# Patient Record
Sex: Female | Born: 1985 | Race: White | Hispanic: No | Marital: Married | Smoking: Never smoker
Health system: Southern US, Community
[De-identification: ages and names within clinical notes are randomized; demographics above are authoritative.]

## PROBLEM LIST (undated history)

## (undated) DIAGNOSIS — N200 Calculus of kidney: Secondary | ICD-10-CM

## (undated) DIAGNOSIS — D72829 Elevated white blood cell count, unspecified: Secondary | ICD-10-CM

## (undated) HISTORY — DX: Calculus of kidney: N20.0

## (undated) HISTORY — DX: Elevated white blood cell count, unspecified: D72.829

---

## 2012-09-14 DIAGNOSIS — F39 Unspecified mood [affective] disorder: Secondary | ICD-10-CM | POA: Insufficient documentation

## 2012-09-14 DIAGNOSIS — J309 Allergic rhinitis, unspecified: Secondary | ICD-10-CM | POA: Insufficient documentation

## 2012-11-19 DIAGNOSIS — Z9109 Other allergy status, other than to drugs and biological substances: Secondary | ICD-10-CM | POA: Insufficient documentation

## 2013-02-07 DIAGNOSIS — N200 Calculus of kidney: Secondary | ICD-10-CM | POA: Insufficient documentation

## 2013-02-20 DIAGNOSIS — N201 Calculus of ureter: Secondary | ICD-10-CM | POA: Insufficient documentation

## 2013-09-07 DIAGNOSIS — K219 Gastro-esophageal reflux disease without esophagitis: Secondary | ICD-10-CM | POA: Insufficient documentation

## 2013-12-13 ENCOUNTER — Ambulatory Visit: Payer: Self-pay | Admitting: Oncology

## 2013-12-13 LAB — CBC CANCER CENTER
BASOS ABS: 0.1 x10 3/mm (ref 0.0–0.1)
Basophil %: 1 %
EOS PCT: 5.7 %
Eosinophil #: 0.8 x10 3/mm — ABNORMAL HIGH (ref 0.0–0.7)
HCT: 39.5 % (ref 35.0–47.0)
HGB: 13 g/dL (ref 12.0–16.0)
LYMPHS PCT: 30.2 %
Lymphocyte #: 4.2 x10 3/mm — ABNORMAL HIGH (ref 1.0–3.6)
MCH: 26.3 pg (ref 26.0–34.0)
MCHC: 32.9 g/dL (ref 32.0–36.0)
MCV: 80 fL (ref 80–100)
Monocyte #: 0.4 x10 3/mm (ref 0.2–0.9)
Monocyte %: 2.8 %
Neutrophil #: 8.3 x10 3/mm — ABNORMAL HIGH (ref 1.4–6.5)
Neutrophil %: 60.3 %
Platelet: 387 x10 3/mm (ref 150–440)
RBC: 4.94 10*6/uL (ref 3.80–5.20)
RDW: 14 % (ref 11.5–14.5)
WBC: 13.8 x10 3/mm — AB (ref 3.6–11.0)

## 2013-12-13 LAB — LACTATE DEHYDROGENASE: LDH: 138 U/L (ref 81–246)

## 2013-12-31 ENCOUNTER — Ambulatory Visit: Payer: Self-pay | Admitting: Oncology

## 2014-01-17 LAB — CBC CANCER CENTER
BASOS ABS: 0.1 x10 3/mm (ref 0.0–0.1)
BASOS PCT: 0.6 %
Eosinophil #: 0.3 x10 3/mm (ref 0.0–0.7)
Eosinophil %: 2.8 %
HCT: 40.1 % (ref 35.0–47.0)
HGB: 12.9 g/dL (ref 12.0–16.0)
Lymphocyte #: 3.2 x10 3/mm (ref 1.0–3.6)
Lymphocyte %: 30.2 %
MCH: 25.4 pg — ABNORMAL LOW (ref 26.0–34.0)
MCHC: 32.1 g/dL (ref 32.0–36.0)
MCV: 79 fL — ABNORMAL LOW (ref 80–100)
Monocyte #: 0.5 x10 3/mm (ref 0.2–0.9)
Monocyte %: 4.4 %
Neutrophil #: 6.5 x10 3/mm (ref 1.4–6.5)
Neutrophil %: 62 %
Platelet: 305 x10 3/mm (ref 150–440)
RBC: 5.06 10*6/uL (ref 3.80–5.20)
RDW: 13.4 % (ref 11.5–14.5)
WBC: 10.5 x10 3/mm (ref 3.6–11.0)

## 2014-01-30 ENCOUNTER — Ambulatory Visit: Payer: Self-pay | Admitting: Oncology

## 2016-07-13 ENCOUNTER — Ambulatory Visit: Payer: Self-pay | Admitting: Medical

## 2017-01-04 ENCOUNTER — Ambulatory Visit
Admission: RE | Admit: 2017-01-04 | Discharge: 2017-01-04 | Disposition: A | Discharge status: Home / Self Care | Payer: BLUE CROSS/BLUE SHIELD | Source: Ambulatory Visit | Attending: Medical | Admitting: Medical

## 2017-01-04 ENCOUNTER — Ambulatory Visit: Payer: Self-pay | Admitting: Medical

## 2017-01-04 ENCOUNTER — Encounter: Payer: Self-pay | Admitting: Medical

## 2017-01-04 VITALS — BP 110/80 | HR 111 | Temp 99.8°F | Resp 16 | Ht 72.0 in | Wt 243.0 lb

## 2017-01-04 DIAGNOSIS — M79671 Pain in right foot: Secondary | ICD-10-CM | POA: Diagnosis not present

## 2017-01-04 NOTE — Progress Notes (Signed)
   Subjective:    Patient ID: Robin Turner, female    DOB: 01/06/1986, 31 y.o.   MRN: [id number]  HPI   31 yo female  In non acute distress  Started on THursdsay with pain on dorsum of right foot, worse on Friday, worse over the weekend, Took ibuprofen 400mg  , one time on Friday , Saturday and Sunday. Feeling better today ( pain level 4/10). no change in swelling. Dorsum  painat junction of  Ankle, increased pain with eversion and inversion.And standing on the forefoot, feels achy like a muscle strain. ON Friday  Had increased pain with driving switching from the gas pedal to the brake pedal. Did not drive today. Husband brought patient to clinic.  Review of Systems  Constitutional: Negative for chills and fever.  HENT: Negative for congestion, ear discharge, rhinorrhea and sore throat.   Eyes: Negative for discharge and itching.  Respiratory: Negative for cough and shortness of breath.   Cardiovascular: Negative for chest pain.  Gastrointestinal: Negative for abdominal pain.  Endocrine: Negative for cold intolerance and heat intolerance.  Genitourinary: Negative for dysuria.  Musculoskeletal: Positive for arthralgias and joint swelling.  Skin: Negative for rash.  Allergic/Immunologic: Positive for environmental allergies. Negative for food allergies.  Neurological: Negative for dizziness, speech difficulty and light-headedness.  Hematological: Negative for adenopathy.  Psychiatric/Behavioral: Negative for behavioral problems, self-injury and suicidal ideas. The patient is not nervous/anxious (takes medication for anxiety Buspar.).        Objective:   Physical Exam  Constitutional: She is oriented to person, place, and time. She appears well-developed and well-nourished.  HENT:  Head: Normocephalic and atraumatic.  Eyes: Conjunctivae and EOM are normal. Pupils are equal, round, and reactive to light.  Neck: Normal range of motion.  Musculoskeletal: She exhibits edema and tenderness.   Neurological: She is alert and oriented to person, place, and time.  Skin: Skin is warm and dry. No rash noted. There is erythema.  Psychiatric: She has a normal mood and affect. Her behavior is normal. Judgment and thought content normal.  Nursing note and vitals reviewed.  Heart rate usually runs in the  70's per patient.  Walks gingerly on to right foot.right  foot swollen along dorsum of foot including the arch area. Plantar surface wnl.  2+PT and DP.Clearly the right foot is swollen compared to the left foot.    Assessment & Plan:  Right foot pain along dorsum and arch,  Sent for x-ray. RICE explained to patient. She is going to try to work from home tomorrow. Ace wrap demonstrated for patient to wear given one from office. I will call her tomorrow with results of the x-ray. She verbalizes understanding and has no questions at discharge  Xray results below, patient called. To return to the clinic in  3-5 days if not imrproving. FINDINGS: There is no evidence of fracture or dislocation. There is no evidence of arthropathy or other focal bone abnormality. Soft tissues are unremarkable.  IMPRESSION: Negative.   Electronically Signed   By: Francene BoyersJames  Maxwell M.D.   On: 01/05/2017 09:03

## 2017-01-04 NOTE — Patient Instructions (Addendum)
Will call tomorrow with test results.   Foot Pain Many things can cause foot pain. Some common causes are:  An injury.  A sprain.  Arthritis.  Blisters.  Bunions.  Follow these instructions at home: Pay attention to any changes in your symptoms. Take these actions to help with your discomfort:  If directed, put ice on the affected area: ? Put ice in a plastic bag. ? Place a towel between your skin and the bag. ? Leave the ice on for 15-20 minutes, 3?4 times a day for 2 days.  Take over-the-counter and prescription medicines only as told by your health care provider.  Wear comfortable, supportive shoes that fit you well. Do not wear high heels.  Do not stand or walk for long periods of time.  Do not lift a lot of weight. This can put added pressure on your feet.  Do stretches to relieve foot pain and stiffness as told by your health care provider.  Rub your foot gently.  Keep your feet clean and dry.  Contact a health care provider if:  Your pain does not get better after a few days of self-care.  Your pain gets worse.  You cannot stand on your foot. Get help right away if:  Your foot is numb or tingling.  Your foot or toes are swollen.  Your foot or toes turn white or blue.  You have warmth and redness along your foot. This information is not intended to replace advice given to you by your health care provider. Make sure you discuss any questions you have with your health care provider. Document Released: 03/15/2015 Document Revised: 07/25/2015 Document Reviewed: 03/14/2014 Elsevier Interactive Patient Education  [employer]2018 [employer] Inc.

## 2017-01-05 ENCOUNTER — Telehealth: Payer: Self-pay

## 2017-01-05 ENCOUNTER — Other Ambulatory Visit: Payer: Self-pay

## 2017-01-05 NOTE — Telephone Encounter (Signed)
Needed to clarify medication list.  Patient added Buspar 7.5mg  BID.  Added to Med list.

## 2017-01-05 NOTE — Progress Notes (Signed)
Please call patient with result, RICE and return to clinic in 3-5 days if not improving. Thank you

## 2017-01-05 NOTE — Telephone Encounter (Signed)
Per H. Ratcliffe, called patient to advise that xray results were received and showed no fracture of foot.  Instructions are to use rest, ice, compression and elevation.  She said the swelling has improved since yesterday and patient reports she is keeping it wrapped with ace bandage.  Advised her to rtcwithin 3-5 days if not improving.

## 2017-02-10 NOTE — Telephone Encounter (Signed)
Duplicate in error

## 2017-04-29 ENCOUNTER — Ambulatory Visit
Admission: RE | Admit: 2017-04-29 | Discharge: 2017-04-29 | Disposition: A | Discharge status: Home / Self Care | Payer: BLUE CROSS/BLUE SHIELD | Source: Ambulatory Visit | Attending: Adult Health | Admitting: Adult Health

## 2017-04-29 ENCOUNTER — Ambulatory Visit: Payer: Self-pay | Admitting: Adult Health

## 2017-04-29 ENCOUNTER — Encounter: Payer: Self-pay | Admitting: Adult Health

## 2017-04-29 VITALS — BP 126/83 | HR 87 | Temp 98.5°F | Resp 16 | Ht 72.0 in | Wt 261.0 lb

## 2017-04-29 DIAGNOSIS — M542 Cervicalgia: Secondary | ICD-10-CM

## 2017-04-29 MED ORDER — CYCLOBENZAPRINE HCL 5 MG PO TABS: 5.0000 mg | ORAL_TABLET | Freq: Every day | ORAL | 0 refills | Status: AC

## 2017-04-29 MED ORDER — PREDNISONE 10 MG (21) PO TBPK: ORAL_TABLET | ORAL | 0 refills | Status: DC

## 2017-04-29 NOTE — Patient Instructions (Signed)
Cervical Radiculopathy  Cervical radiculopathy means that a nerve in the neck is pinched or bruised. This can cause pain or loss of feeling (numbness) that runs from your neck to your arm and fingers.  Follow these instructions at home:  Managing pain  ? Take over-the-counter and prescription medicines only as told by your doctor.  ? If directed, put ice on the injured or painful area.  ? Put ice in a plastic bag.  ? Place a towel between your skin and the bag.  ? Leave the ice on for 20 minutes, 2?3 times per day.  ? If ice does not help, you can try using heat. Take a warm shower or warm bath, or use a heat pack as told by your doctor.  ? You may try a gentle neck and shoulder massage.  Activity  ? Rest as needed. Follow instructions from your doctor about any activities to avoid.  ? Do exercises as told by your doctor or physical therapist.  General instructions  ? If you were given a soft collar, wear it as told by your doctor.  ? Use a flat pillow when you sleep.  ? Keep all follow-up visits as told by your doctor. This is important.  Contact a doctor if:  ? Your condition does not improve with treatment.  Get help right away if:  ? Your pain gets worse and is not controlled with medicine.  ? You lose feeling or feel weak in your hand, arm, face, or leg.  ? You have a fever.  ? You have a stiff neck.  ? You cannot control when you poop or pee (have incontinence).  ? You have trouble with walking, balance, or talking.  This information is not intended to replace advice given to you by your health care provider. Make sure you discuss any questions you have with your health care provider.  Document Released: 02/05/2011 Document Revised: 07/25/2015 Document Reviewed: 04/12/2014  Elsevier Interactive Patient Education ? 2018 Elsevier Inc.

## 2017-04-29 NOTE — Progress Notes (Signed)
Subjective:     Patient ID: Robin Turner, female   DOB: 17-Sep-1985, 32 y.o.   MRN: [id number]  Neck Pain   This is a new (reports this has happened before and resolved on its own but keeps reoccuring.) problem. The current episode started in the past 7 days (Started this monday ). Progression since onset: she reports it was worsening but since yesterday is improved some. The pain is associated with lifting a heavy object (pulling up her pants/ sits at desk - does have standing test ). Quality: ' pulling " feeling does radiate to her hand from right side neck/ back to hand.  The pain is at a severity of 3/10 (with movemnet only ). The pain is mild. The symptoms are aggravated by bending and position. The pain is same all the time (consistent aggervation ). Associated symptoms include numbness (intermittently right arm to hand can change with posistion changes ). Pertinent negatives include no chest pain, fever, headaches, leg pain, pain with swallowing, paresis, photophobia, syncope, tingling, trouble swallowing, visual change, weakness or weight loss. She has tried NSAIDs (took for first time at bed time the other night ) for the symptoms. The treatment provided mild relief.   She reports 2 years ago she helped a friend move and pushed a box in moving truck and at that time it pushed her back and she noticed a pain at that time in her neck and back - this all started after this incident.  She reports it flares every two to four months and sometimes last few days to week.  She denies having any radiology studies.   Patient  denies any fever, chills, rash, chest pain, shortness of breath, nausea, vomiting, or diarrhea. Denies shortness of breath. Denies any pain with inspiration or expiration.   She denies crepitus or joint locking.  Denies any lower extremity pain or  Radiculopathy to lower extremities.    Patient's last menstrual period was 04/12/2017.  Denies chance of pregnancy.   does not have  a problem list on file. Denies any medical problems - she denies any liver or kidney dysfunction.   Non smoker  Current Outpatient Medications:  .  buPROPion (WELLBUTRIN XL) 300 MG 24 hr tablet, Take by mouth., Disp: , Rfl:  .  busPIRone (BUSPAR) 7.5 MG tablet, Take 7.5 mg 2 (two) times daily by mouth., Disp: , Rfl:  .  phentermine (ADIPEX-P) 37.5 MG tablet, Take by mouth., Disp: , Rfl:  .  omeprazole (PRILOSEC) 20 MG capsule, TK 1 C PO ONCE D, Disp: , Rfl: 9  No Known Allergies  Taking Adipex intermittently x 1 year she sees Dr. Greggory Stallion at [facility].She skips this frequently and has not taken it consistently for one year.   Patient Active Problem List   Diagnosis Date Noted  . GERD (gastroesophageal reflux disease) 09/07/2013  . Ureteric stone 02/20/2013  . Nephrolithiasis 02/07/2013  . Environmental allergies 11/19/2012  . Allergic rhinitis 09/14/2012  . Mood disorder (HCC) 09/14/2012    Review of Systems  Constitutional: Negative for activity change, appetite change, chills, diaphoresis, fatigue, fever, unexpected weight change and weight loss.  HENT: Negative.  Negative for trouble swallowing.   Eyes: Negative.  Negative for photophobia.  Respiratory: Negative.   Cardiovascular: Negative for chest pain and syncope.  Gastrointestinal: Negative.   Endocrine: Negative.   Genitourinary: Negative.   Musculoskeletal: Positive for neck pain. Negative for arthralgias, back pain, gait problem, joint swelling, myalgias and neck stiffness.  Skin:  Negative.   Allergic/Immunologic: Negative.   Neurological: Positive for numbness (intermittently right arm to hand can change with posistion changes ). Negative for dizziness, tingling, tremors, seizures, syncope, facial asymmetry, speech difficulty, weakness, light-headedness and headaches.  Hematological: Negative.   Psychiatric/Behavioral: Negative.        Objective:   Physical Exam  Constitutional: She is oriented to person, place,  and time. She appears well-developed and well-nourished. No distress.  HENT:  Head: Normocephalic and atraumatic.  Right Ear: External ear normal.  Left Ear: External ear normal.  Nose: Nose normal.  Mouth/Throat: Oropharynx is clear and moist. No oropharyngeal exudate.  Eyes: Conjunctivae and EOM are normal. Pupils are equal, round, and reactive to light. Right eye exhibits no discharge. Left eye exhibits no discharge. No scleral icterus.  Neck: Normal range of motion. Neck supple. No JVD present. No tracheal deviation present.  Cardiovascular: Normal rate, regular rhythm, normal heart sounds and intact distal pulses. Exam reveals no gallop and no friction rub.  No murmur heard. Pulmonary/Chest: Effort normal and breath sounds normal. No stridor. No respiratory distress. She has no wheezes. She has no rales. She exhibits no tenderness.  Abdominal: Soft. Bowel sounds are normal.  Musculoskeletal:       Right shoulder: Normal.       Right elbow: Normal.She exhibits normal range of motion, no swelling, no effusion, no deformity and no laceration. No tenderness found. No radial head, no medial epicondyle, no lateral epicondyle and no olecranon process tenderness noted.       Right wrist: She exhibits laceration. She exhibits normal range of motion, no tenderness, no bony tenderness, no swelling, no effusion, no crepitus and no deformity.       Cervical back: She exhibits decreased range of motion (no decreased ROM though patient reports movements to tuck her chin back cause aggervation and feelling of mild numbness from shoulder to hand right side ). She exhibits no tenderness, no bony tenderness, no swelling, no edema, no deformity, no laceration, no pain, no spasm and normal pulse.       Thoracic back: Normal.       Right upper arm: Normal. She exhibits no tenderness, no bony tenderness, no swelling, no edema, no deformity and no laceration.       Right forearm: Normal. She exhibits no tenderness,  no bony tenderness, no swelling, no edema, no deformity and no laceration.       Arms:      Right hand: Normal. She exhibits normal range of motion, no tenderness, no bony tenderness, normal two-point discrimination, normal capillary refill, no deformity, no laceration and no swelling. Normal sensation noted. Decreased sensation is not present in the ulnar distribution, is not present in the medial distribution and is not present in the radial distribution. Normal strength noted. She exhibits no finger abduction, no thumb/finger opposition and no wrist extension trouble.  Right trapezius where patient points to area of tenderness.   Grip strength normal bilateral hands. Muscle strength normal bilateral upper extremities.   Radial pulse 2+ bilateral   No reproducible pain with movement or palpation.   No crepitus   Lymphadenopathy:    She has no cervical adenopathy.  Neurological: She is alert and oriented to person, place, and time. She has normal strength and normal reflexes. She is not disoriented. She displays no atrophy, no tremor and normal reflexes. No cranial nerve deficit or sensory deficit. She exhibits normal muscle tone. She displays a negative Romberg sign. She displays no  seizure activity. Coordination and gait normal. GCS eye subscore is 4. GCS verbal subscore is 5. GCS motor subscore is 6.  Skin: Skin is warm, dry and intact. No rash noted. She is not diaphoretic. No cyanosis or erythema. No pallor. Nails show no clubbing.   Clothed other areas.   Psychiatric: She has a normal mood and affect. Her speech is normal and behavior is normal. Judgment and thought content normal. Cognition and memory are normal.  Vitals reviewed.      Assessment:     Cervical pain (neck) - Plan: DG Cervical Spine Complete, DG Thoracic Spine 2 View      Plan:        Meds ordered this encounter  Medications  . predniSONE (STERAPRED UNI-PAK 21 TAB) 10 MG (21) TBPK tablet    Sig: By mouth Take  6 tablets on day 1, Take 5 tablets day 2 Take 4 tablets day 3 Take 3 tablets day 4 Take 2 tablets day five 5 Take 1 tablet day    Dispense:  21 tablet    Refill:  0  . cyclobenzaprine (FLEXERIL) 5 MG tablet    Sig: Take 1 tablet (5 mg total) by mouth at bedtime.    Dispense:  15 tablet    Refill:  0   Orders Placed This Encounter  Procedures  . DG Cervical Spine Complete    Standing Status:   Future    Number of Occurrences:   1    Standing Expiration Date:   06/28/2018    Order Specific Question:   Reason for Exam (SYMPTOM  OR DIAGNOSIS REQUIRED)    Answer:   pain right arm radiated to hand - previous injury possible pushing a box    Order Specific Question:   Is patient pregnant?    Answer:   No    Comments:   04/19/2017     Order Specific Question:   Preferred imaging location?    Answer:   [facility]    Order Specific Question:   Call Results- Best Contact Number?    Answer:   [phone]    Order Specific Question:   Radiology Contrast Protocol - do NOT remove file path    Answer:   \\[email or web address]  . DG Thoracic Spine 2 View    Standing Status:   Future    Number of Occurrences:   1    Standing Expiration Date:   06/28/2018    Order Specific Question:   Reason for Exam (SYMPTOM  OR DIAGNOSIS REQUIRED)    Answer:   pain radiates from back/ neck to right arm hand- possible previous injjury pushing large box 2 years ago    Order Specific Question:   Is patient pregnant?    Answer:   No    Comments:   04/19/2017     Order Specific Question:   Preferred imaging location?    Answer:   [facility]    Order Specific Question:   Call Results- Best Contact Number?    Answer:   [phone]    Order Specific Question:   Radiology Contrast Protocol - do NOT remove file path    Answer:   \\[email or web address]   Pending x-ray results- will call when returns-patient will call office if no call within  5 days.   Advised to follow up with Rayetta Humphrey, MD  Regarding Adipex- reviewed safety issues associated with it as soon as possible. Also advised could be correlation between medication and health.  Also advised to monitor heart rate and blood pressure and follow up with Rayetta HumphreyGeorge, Sionne A, MD  Follow up with your primary care MD, may return to this office 2 week follow up or see PCP.  Advised to return to clinic for an appointment if no improvement within 72 hours or if any symptoms change or worsen. Advised ER or urgent Care if after hours or on weekend. 911 for emergency symptoms at any time.  Patient verbalized understanding of all instructions given and denies any further questions at this time.

## 2017-05-05 ENCOUNTER — Telehealth: Payer: Self-pay

## 2017-05-06 NOTE — Progress Notes (Signed)
Contacted patient to let her know her xray was normal. She is to follow up with her primary care provider if symptoms return or get any worse.

## 2017-05-13 ENCOUNTER — Ambulatory Visit: Payer: Self-pay | Admitting: Adult Health

## 2017-05-13 ENCOUNTER — Encounter: Payer: Self-pay | Admitting: Adult Health

## 2017-05-13 VITALS — BP 122/75 | HR 98 | Temp 97.9°F | Resp 16 | Ht 72.0 in | Wt 262.0 lb

## 2017-05-13 DIAGNOSIS — M7711 Lateral epicondylitis, right elbow: Secondary | ICD-10-CM

## 2017-05-13 NOTE — Patient Instructions (Signed)
Tennis Elbow Tennis elbow is puffiness (inflammation) of the outer tendons of your forearm close to your elbow. Your tendons attach your muscles to your bones. Tennis elbow can happen in any sport or job in which you use your elbow too much. It is caused by doing the same motion over and over. Tennis elbow can cause:  Pain and tenderness in your forearm and the outer part of your elbow.  A burning feeling. This runs from your elbow through your arm.  Weak grip in your hands.  Follow these instructions at home: Activity  Rest your elbow and wrist as told by your doctor. Try to avoid any activities that caused the problem until your doctor says that you can do them again.  If a physical therapist teaches you exercises, do all of them as told.  If you lift an object, lift it with your palm facing up. This is easier on your elbow. Lifestyle  If your tennis elbow is caused by sports, check your equipment and make sure that: ? You are using it correctly. ? It fits you well.  If your tennis elbow is caused by work, take breaks often, if you are able. Talk with your manager about doing your work in a way that is safe for you. ? If your tennis elbow is caused by computer use, talk with your manager about any changes that can be made to your work setup. General instructions  If told, apply ice to the painful area: ? Put ice in a plastic bag. ? Place a towel between your skin and the bag. ? Leave the ice on for 20 minutes, 2-3 times per day.  Take medicines only as told by your doctor.  If you were given a brace, wear it as told by your doctor.  Keep all follow-up visits as told by your doctor. This is important. Contact a doctor if:  Your pain does not get better with treatment.  Your pain gets worse.  You have weakness in your forearm, hand, or fingers.  You cannot feel your forearm, hand, or fingers. This information is not intended to replace advice given to you by your health  care provider. Make sure you discuss any questions you have with your health care provider. Document Released: 08/06/2009 Document Revised: 10/17/2015 Document Reviewed: 02/12/2014 Elsevier Interactive Patient Education  2018 [employer]  

## 2017-05-13 NOTE — Progress Notes (Signed)
Subjective:     Patient ID: Robin Turner, female   DOB: 1985/04/20, 32 y.o.   MRN: [id number]  HPI She comes to the clinic for recheck of her neck and right arm pain. She reports her neck pain has resolved with Prednisone - though she reports she  did not finish half of the pack because she was feeling better and forgot to take medication to work. She denies any back pain. She denies any radiation of pain after Prednisone she did take. Denies paresthesias.   X ray results cervical spine and thoracic spine have been reviewed previously by phone.   She reports today that she does have intermittent elbow pain and uses a lot of repetitive motions at her desk. She has not taken any NSAID'S since last visit.   She has not done Rest Ice Compression or Elevation.   She uses her hands on computer frequently and sometimes has irritation in her right pointer finger right hand though this is reported to be occasional. Denies numbness or  tingling or dropping any objects.   Patient  denies any fever, body aches,chills, rash, chest pain, shortness of breath, nausea, vomiting, or diarrhea.   Review of Systems  Constitutional: Negative.   HENT: Negative.   Eyes: Negative.   Respiratory: Negative.   Cardiovascular: Negative.   Gastrointestinal: Negative.   Endocrine: Negative.   Genitourinary: Negative.   Musculoskeletal: Negative for arthralgias, back pain, gait problem, joint swelling, myalgias, neck pain and neck stiffness.       Right elbow pain.   Skin: Negative.   Neurological: Negative.   Hematological: Negative.   Psychiatric/Behavioral: Negative.        Objective:   Physical Exam  Constitutional: She is oriented to person, place, and time. She appears well-developed and well-nourished. No distress.  HENT:  Head: Normocephalic and atraumatic.  Mouth/Throat: No oropharyngeal exudate.  Eyes: Conjunctivae and EOM are normal. Pupils are equal, round, and reactive to light.  Neck: Normal  range of motion. Neck supple.  Cardiovascular: Normal rate, regular rhythm, normal heart sounds and intact distal pulses. Exam reveals no gallop and no friction rub.  No murmur heard. Pulmonary/Chest: Effort normal and breath sounds normal. No respiratory distress. She has no wheezes. She has no rales. She exhibits no tenderness.  Abdominal: Soft. She exhibits no distension.  Musculoskeletal: Normal range of motion.       Right shoulder: Normal.       Right elbow: She exhibits normal range of motion, no swelling, no effusion, no deformity and no laceration. Tenderness found. Lateral epicondyle (patient reports very mild with palpation) tenderness noted. No radial head, no medial epicondyle and no olecranon process tenderness noted.       Right wrist: Normal.       Cervical back: Normal.  Tinels Test negative   Neurological: She is alert and oriented to person, place, and time. She has normal reflexes.  Skin: Skin is warm and dry. No rash noted. She is not diaphoretic. No erythema. No pallor.  Psychiatric: She has a normal mood and affect. Her behavior is normal. Judgment and thought content normal.       Assessment:     Lateral epicondylitis of right elbow      Plan:       Patient given AVS education handout and reviewed with patient for care guidelines.  She is going  To finish the Prednisone pack she has left and then after this will try Motrin 600 mg every  8 hours as needed for 7 days to see if relief.   If no relief or symptoms worsen at anytime she was given [facility] and aware of Urgent Care Walk in 1 pm to 7 pm daily Monday through Friday.   She is also advised to follow up with her Primary care MD and keep regularly advised appointments/ yearly physicals.   Advised patient call the office or your primary care doctor for an appointment if no improvement within 72 hours or if any symptoms change or worsen at any time  Advised ER or urgent Care if after hours  or on weekend. Call 911 for emergency symptoms at any time.Patinet verbalized understanding of all instructions given/reviewed and treatment plan and has no further questions or concerns at this time.

## 2017-10-20 ENCOUNTER — Encounter: Payer: Self-pay | Admitting: Adult Health

## 2017-10-20 ENCOUNTER — Ambulatory Visit: Payer: Self-pay | Admitting: Adult Health

## 2017-10-20 VITALS — BP 140/80 | HR 88 | Temp 98.3°F | Resp 16 | Ht 72.0 in | Wt 263.0 lb

## 2017-10-20 DIAGNOSIS — M79671 Pain in right foot: Secondary | ICD-10-CM

## 2017-10-20 NOTE — Patient Instructions (Signed)
Plantar Fasciitis Plantar fasciitis is a painful foot condition that affects the heel. It occurs when the band of tissue that connects the toes to the heel bone (plantar fascia) becomes irritated. This can happen after exercising too much or doing other repetitive activities (overuse injury). The pain from plantar fasciitis can range from mild irritation to severe pain that makes it difficult for you to walk or move. The pain is usually worse in the morning or after you have been sitting or lying down for a while. What are the causes? This condition may be caused by:  Standing for long periods of time.  Wearing shoes that do not fit.  Doing high-impact activities, including running, aerobics, and ballet.  Being overweight.  Having an abnormal way of walking (gait).  Having tight calf muscles.  Having high arches in your feet.  Starting a new athletic activity.  What are the signs or symptoms? The main symptom of this condition is heel pain. Other symptoms include:  Pain that gets worse after activity or exercise.  Pain that is worse in the morning or after resting.  Pain that goes away after you walk for a few minutes.  How is this diagnosed? This condition may be diagnosed based on your signs and symptoms. Your health care provider will also do a physical exam to check for:  A tender area on the bottom of your foot.  A high arch in your foot.  Pain when you move your foot.  Difficulty moving your foot.  You may also need to have imaging studies to confirm the diagnosis. These can include:  X-rays.  Ultrasound.  MRI.  How is this treated? Treatment for plantar fasciitis depends on the severity of the condition. Your treatment may include:  Rest, ice, and over-the-counter pain medicines to manage your pain.  Exercises to stretch your calves and your plantar fascia.  A splint that holds your foot in a stretched, upward position while you sleep (night  splint).  Physical therapy to relieve symptoms and prevent problems in the future.  Cortisone injections to relieve severe pain.  Extracorporeal shock wave therapy (ESWT) to stimulate damaged plantar fascia with electrical impulses. It is often used as a last resort before surgery.  Surgery, if other treatments have not worked after 12 months.  Follow these instructions at home:  Take medicines only as directed by your health care provider.  Avoid activities that cause pain.  Roll the bottom of your foot over a bag of ice or a bottle of cold water. Do this for 20 minutes, 3-4 times a day.  Perform simple stretches as directed by your health care provider.  Try wearing athletic shoes with air-sole or gel-sole cushions or soft shoe inserts.  Wear a night splint while sleeping, if directed by your health care provider.  Keep all follow-up appointments with your health care provider. How is this prevented?  Do not perform exercises or activities that cause heel pain.  Consider finding low-impact activities if you continue to have problems.  Lose weight if you need to. The best way to prevent plantar fasciitis is to avoid the activities that aggravate your plantar fascia. Contact a health care provider if:  Your symptoms do not go away after treatment with home care measures.  Your pain gets worse.  Your pain affects your ability to move or do your daily activities. This information is not intended to replace advice given to you by your health care provider. Make sure you   discuss any questions you have with your health care provider. Document Released: 11/11/2000 Document Revised: 07/22/2015 Document Reviewed: 12/27/2013 Elsevier Interactive Patient Education  2018 Elsevier Inc. Foot Pain Many things can cause foot pain. Some common causes are:  An injury.  A sprain.  Arthritis.  Blisters.  Bunions.  Follow these instructions at home: Pay attention to any changes  in your symptoms. Take these actions to help with your discomfort:  If directed, put ice on the affected area: ? Put ice in a plastic bag. ? Place a towel between your skin and the bag. ? Leave the ice on for 15-20 minutes, 3?4 times a day for 2 days.  Take over-the-counter and prescription medicines only as told by your health care provider.  Wear comfortable, supportive shoes that fit you well. Do not wear high heels.  Do not stand or walk for long periods of time.  Do not lift a lot of weight. This can put added pressure on your feet.  Do stretches to relieve foot pain and stiffness as told by your health care provider.  Rub your foot gently.  Keep your feet clean and dry.  Contact a health care provider if:  Your pain does not get better after a few days of self-care.  Your pain gets worse.  You cannot stand on your foot. Get help right away if:  Your foot is numb or tingling.  Your foot or toes are swollen.  Your foot or toes turn white or blue.  You have warmth and redness along your foot. This information is not intended to replace advice given to you by your health care provider. Make sure you discuss any questions you have with your health care provider. Document Released: 03/15/2015 Document Revised: 07/25/2015 Document Reviewed: 03/14/2014 Elsevier Interactive Patient Education  [employer]2018 Elsevier Inc.

## 2017-10-20 NOTE — Progress Notes (Signed)
Subjective:     Patient ID: Robin Turner, female   DOB: [birth year], 32 y.o.   MRN: [id number]  HPI  Blood pressure 140/80, pulse 88, temperature 98.3 F (36.8 C), resp. rate 16, height 6' (1.829 m), weight 263 lb (119.3 kg), SpO2 100 %.   Patient is a [age] female in no acute distress with right ankle medial ankle pain as well as plantar surface of right foot. She reports pain in " bottom of foot feels very tight " . Heel pan.  She denies any injury. She denies any history of trauma.  Foot has felt " extremely sore for  three weeks without any improvement with RICE.  Described pain as ache worse with walking. Pain increased in mornings upon wakening.  She has not taken any ibuprofen.  Pain with walking.  Wearing supportive shoes.    She reports she has had this pain before on 01/04/2017 and it resolved with rest but that pain was on top of her foot and medial ankle- she had negative foot x ray at that time.    Review of Systems  Constitutional: Negative.   HENT: Negative.   Eyes: Negative.   Respiratory: Negative.   Cardiovascular: Negative.   Gastrointestinal: Negative.   Endocrine: Negative.   Musculoskeletal: Positive for arthralgias, gait problem and joint swelling (mild right medial ankle swelling ). Negative for back pain, myalgias, neck pain and neck stiffness.  Skin: Negative.   Allergic/Immunologic: Negative.   Neurological: Negative for dizziness, tremors, seizures, syncope, facial asymmetry, speech difficulty, weakness, light-headedness, numbness and headaches.  Hematological: Negative.   Psychiatric/Behavioral: Negative.        Objective:   Physical Exam  Constitutional: She is oriented to person, place, and time. She appears well-developed and well-nourished. No distress.  HENT:  Head: Normocephalic and atraumatic.  Eyes: Pupils are equal, round, and reactive to light. EOM are normal.  Neck: Normal range of motion. Neck supple.  Cardiovascular: Normal rate,  regular rhythm, normal heart sounds and intact distal pulses. Exam reveals no gallop and no friction rub.  No murmur heard. Pulmonary/Chest: Effort normal and breath sounds normal.  Musculoskeletal:       Right ankle: She exhibits swelling (very minimal swelling noticiable right medial distal ankle only ). She exhibits normal range of motion, no ecchymosis, no deformity, no laceration and normal pulse. No tenderness. No lateral malleolus, no medial malleolus, no AITFL, no CF ligament, no posterior TFL, no head of 5th metatarsal and no proximal fibula tenderness found. Achilles tendon exhibits no pain, no defect and normal Thompson's test results.       Feet:  Areas of pain discomfort marked on foot. Minimal swelling area marked medial ankle right - distal.   Neurological: She is alert and oriented to person, place, and time.  Skin: Skin is warm, dry and intact. Capillary refill takes less than 2 seconds. She is not diaphoretic.  Psychiatric: She has a normal mood and affect. Her speech is normal and behavior is normal. Judgment and thought content normal. Cognition and memory are normal.  Vitals reviewed.      Assessment:     Right foot pain      Plan:       Apply a compressive ACE bandage. Rest and elevate the affected painful area.  Apply cold compresses intermittently as needed.  As pain recedes, begin normal activities slowly as tolerated.  Call if symptoms persist. Discussed possible differential of plantar fascitis given symptoms would like patient to  be seen at [facility] walk in clinic within the next 1-2 days for evaluation by orthopedics.  X-ray available at [facility] and hours for walk in patients are 1 pm to 7 pm Monday to Friday- patient is aware.  Advised patient call the office or your primary care doctor for an appointment if no improvement within 72 hours or if any symptoms change or worsen at any time  Advised ER or urgent Care if after hours or on  weekend. Call 911 for emergency symptoms at any time.Patinet verbalized understanding of all instructions given/reviewed and treatment plan and has no further questions or concerns at this time.    Patient verbalized understanding of all instructions given and denies any further questions at this time.

## 2017-10-22 ENCOUNTER — Ambulatory Visit: Payer: Self-pay

## 2018-03-28 ENCOUNTER — Ambulatory Visit: Payer: Self-pay | Admitting: Medical

## 2018-03-28 ENCOUNTER — Encounter: Payer: Self-pay | Admitting: Medical

## 2018-03-28 VITALS — BP 151/94 | HR 97 | Temp 98.4°F | Resp 16 | Wt 262.4 lb

## 2018-03-28 DIAGNOSIS — S060X0A Concussion without loss of consciousness, initial encounter: Secondary | ICD-10-CM

## 2018-03-28 DIAGNOSIS — S0011XA Contusion of right eyelid and periocular area, initial encounter: Secondary | ICD-10-CM

## 2018-03-28 NOTE — Patient Instructions (Signed)
Concussion, Adult  A concussion is a brain injury from a direct hit (blow) to the head or body. This injury causes the brain to shake quickly back and forth inside the skull. It is caused by:  · A hit to the head.  · A quick and sudden movement (jolt) of the head or neck.  How fast you will get better from a concussion depends on many things. Recovery can take time. It is important to wait to return to activity until a doctor says it is safe and your symptoms are all gone.  Follow these instructions at home:  Activity  · Limit activities that need a lot of thought or concentration. You may need to talk with your work manager or teachers about this. Limit activities such as:  ? Homework or work for your job.  ? Watching TV.  ? Computer work.  ? Playing memory games and puzzles.  · Rest. Rest helps the brain to heal. Make sure you:  ? Get plenty of sleep at night. Do not stay up late.  ? Rest during the day. Take naps or rest breaks when you feel tired.  · Do not do activities that could cause a second concussion, such as riding a bike or playing sports. It can be dangerous if you get another concussion before the first one has healed.  · Ask your doctor when you can return to your normal activities, like driving, riding a bike, or using machinery. Your ability to react may be slower. Do not do these activities if you are dizzy. Your doctor will likely give you a plan for slowly going back to activities.  General instructions  · Take over-the-counter and prescription medicines only as told by your doctor.  · Do not drink alcohol until your doctor says you can.  · Watch your symptoms and tell other people to do the same. Other problems (complications) can happen after a concussion. Older adults with a brain injury may have a higher risk of serious problems, such as a blood clot in the brain.  · Tell your work manager, teachers, school nurse, school counselor, coach, or athletic trainer about your injury and symptoms.  Tell them about what you can or cannot do. They should watch you for:  ? More problems with attention or concentration.  ? More trouble remembering or learning new information.  ? More time needed to do tasks or assignments.  ? Being more annoyed (irritable) or having a harder time dealing with stress.  ? Any other symptoms that get worse.  · Keep all follow-up visits as told by your doctor. This is important.  Prevention  · It is very important that you donot get another brain injury, especially before you have healed. In rare cases, another injury can cause permanent brain damage, brain swelling, or death. You have the most risk if you get another head injury in the first 7-10 days after you were hurt before. To avoid injuries:  ? Avoid activities that could make you get a second concussion, like contact sports.  ? When you have returned to sports or activities:  § Avoid plays or moves that can cause you to crash into another person. This is how most concussions happen.  § Follow the rules and be respectful of other players.  ? Get regular exercise that includes strength and balance training.  ? Wear a helmet when you do activities like:  § Biking.  § Skiing.  § Skateboarding.  § Skating.  ?   Helmets can help protect you from serious skull and brain injuries, but they do not protect your from a concussion. Even when wearing a helmet, you should avoid being hit in the head.  Contact a doctor if:  · Your symptoms get worse or they do not get better.  · You have new symptoms.  · You have another injury.  Get help right away if:  · You have bad headaches or your headaches get worse.  · You have weakness in any part of your body.  · You are confused.  · Your coordination gets worse.  · You keep throwing up (vomiting).  · You feel more sleepy than normal.  · You twitch or shake violently (convulse) or have a seizure.  · Your speech is not clear (is slurred).  · You have strange behavior changes.  · You have changes in  how you see (vision).  · You pass out (lose consciousness).  Summary  · A concussion is a brain injury from a direct hit (blow) to the head or body.  · This condition is treated with rest and careful watching of symptoms.  · If you keep having symptoms, call your doctor.  This information is not intended to replace advice given to you by your health care provider. Make sure you discuss any questions you have with your health care provider.  Document Released: 02/04/2009 Document Revised: 03/30/2017 Document Reviewed: 03/30/2017  Elsevier Interactive Patient Education © 2019 Elsevier Inc.

## 2018-03-28 NOTE — Progress Notes (Signed)
Subjective:    Patient ID: Robin Turner, female    DOB: Aug 28, 1985, 33 y.o.   MRN: [id number]  HPI 33 yo female in non acute distress. Tripped on dog toy, and fell , hit head on the corner of a padded chair yesterday. No neck pain , No loss of consciousness. Husband was with patient at the time of injury.He also is with her today to provide "moral support". Took an Aleve and laid down and slept for about 30 minutes. She then got up and resumed her nightly activity. She complaints of feeling a little lightheaded and nauseous today  Was Able to eat diner last night Pizza.She does have a history of anxiety and she states she feels anxious today.    Cervical xray 2/29 showed IMPRESSION: 1. Reversal of the normal cervical spine curvature. No acute fracture. 2. Normal intervertebral disc spaces.  No foraminal narrowing   Tdap with  10 yrs.  Left handed     Past Medical History:  Diagnosis Date  . Elevated WBC count   . Kidney stone    removed through the urethra 2014   ROS no recent fever or chills  No pain with movement of the eye , no visual changes. Denies neck or back pain. Complains of some weakness in the right arm, seen previously for lateral epicondilytis. Nauseous to day, and some lightheadedness. Contusion ot right lateral of eye tender. No pain with movement of the eyeball.  Denies numbness or tingling. History of Anxiety, feels anxious this morning. 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   Objective:   Physical Exam Vitals signs and nursing note reviewed.  Constitutional:      General: She is not in acute distress.    Appearance: Normal appearance. She is obese. She is not ill-appearing, toxic-appearing or diaphoretic.  HENT:     Head: Normocephalic.     Comments: Right side of  Right  Eye contusion noted.    Right Ear: Ear canal and external ear normal.     Left Ear: Ear canal and external ear normal.     Nose: Congestion present.  No rhinorrhea.     Mouth/Throat:     Mouth: Mucous membranes are moist.     Pharynx: No oropharyngeal exudate or posterior oropharyngeal erythema.  Eyes:     General: Lids are normal. Vision grossly intact. Gaze aligned appropriately. Scleral icterus present. No allergic shiner or visual field deficit.       Right eye: No discharge.        Left eye: No discharge.     Extraocular Movements: Extraocular movements intact.     Right eye: Normal extraocular motion and no nystagmus.     Left eye: Normal extraocular motion and no nystagmus.     Conjunctiva/sclera: Conjunctivae normal.     Pupils: Pupils are equal, round, and reactive to light.  Neck:     Musculoskeletal: Normal range of motion and neck supple. No muscular tenderness.     Thyroid: No thyromegaly.  Cardiovascular:     Rate and Rhythm: Normal rate and regular rhythm.     Heart sounds: Normal heart sounds.  Musculoskeletal: Normal range of motion.  Lymphadenopathy:     Cervical: No cervical adenopathy.  Skin:    General: Skin is warm and dry.     Findings: Abrasion and bruising present.     Nails: There is no clubbing.  Comments: 2 abrasions to right knee Non tender with drawers test. Mild edema below patella, no bruising noted.2+ PT  Neurological:     General: No focal deficit present.     Mental Status: She is alert and oriented to person, place, and time. Mental status is at baseline.     GCS: GCS eye subscore is 4. GCS verbal subscore is 5. GCS motor subscore is 6.     Cranial Nerves: Cranial nerves are intact. No cranial nerve deficit or facial asymmetry.     Sensory: Sensation is intact. No sensory deficit.     Motor: Motor function is intact. No weakness.     Coordination: Coordination is intact. Romberg sign negative. Coordination normal. Finger-Nose-Finger Test and Heel to [facility] Test normal.     Gait: Gait normal.  Psychiatric:        Attention and Perception: Attention and perception normal.         Mood and Affect: Mood normal.        Speech: Speech normal.        Behavior: Behavior normal. Behavior is cooperative.        Thought Content: Thought content normal.        Cognition and Memory: Cognition and memory normal.        Judgment: Judgment normal.    5/5 grip equal bilaterally, right arm able to adduct and abduct internal and external rotation with out difficulty.  Neruologically intact, gait wnl , steady. Gets off exam table without difficulty  ACE score 4/22    Assessment & Plan:  Concussion without loss of consciousness., Contusion to lateral side of right eye.( given ice pack). Abrasion to the right knee Tdap uptodate per patient. Ice to the contused area of the right lateral area of the right eye. OTC Tylenol as needed for pain per package instructins. She states she feels okay to return to work today. ACE form completed. Recheck in 2 days with me or your primary doctor. Reviewed red flag signs/ symptoms with patient and her husband and if these occur to go to the Emergency Department. Patient and husband verbalize understanding and have no questions at discharge.
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
## 2018-12-28 ENCOUNTER — Ambulatory Visit: Payer: Self-pay

## 2018-12-28 ENCOUNTER — Other Ambulatory Visit: Payer: Self-pay

## 2018-12-28 DIAGNOSIS — Z23 Encounter for immunization: Secondary | ICD-10-CM

## 2019-10-27 ENCOUNTER — Telehealth: Payer: Self-pay | Admitting: Medical

## 2019-10-27 ENCOUNTER — Ambulatory Visit: Payer: Self-pay
